# Patient Record
Sex: Female | Born: 1990 | Race: White | Hispanic: Yes | Marital: Married
Health system: Southern US, Community
[De-identification: ages and names within clinical notes are randomized; demographics above are authoritative.]

---

## 2019-10-03 ENCOUNTER — Inpatient Hospital Stay (HOSPITAL_COMMUNITY): Admission: AD | Admit: 2019-10-03 | Payer: Self-pay | Source: Home / Self Care | Admitting: Obstetrics & Gynecology

## 2019-10-29 ENCOUNTER — Other Ambulatory Visit: Payer: Self-pay | Admitting: Family Medicine

## 2019-10-29 DIAGNOSIS — Z3480 Encounter for supervision of other normal pregnancy, unspecified trimester: Secondary | ICD-10-CM

## 2019-11-07 ENCOUNTER — Other Ambulatory Visit: Payer: Self-pay

## 2019-11-07 ENCOUNTER — Ambulatory Visit
Admission: RE | Admit: 2019-11-07 | Discharge: 2019-11-07 | Disposition: A | Discharge status: Home / Self Care | Payer: Self-pay | Source: Ambulatory Visit | Attending: Family Medicine | Admitting: Family Medicine

## 2019-11-07 DIAGNOSIS — Z3480 Encounter for supervision of other normal pregnancy, unspecified trimester: Secondary | ICD-10-CM | POA: Insufficient documentation

## 2021-07-30 ENCOUNTER — Emergency Department: Payer: Self-pay

## 2021-07-30 ENCOUNTER — Emergency Department
Admission: EM | Admit: 2021-07-30 | Discharge: 2021-07-30 | Disposition: A | Discharge status: Home / Self Care | Payer: Self-pay | Attending: Emergency Medicine | Admitting: Emergency Medicine

## 2021-07-30 DIAGNOSIS — K2100 Gastro-esophageal reflux disease with esophagitis, without bleeding: Secondary | ICD-10-CM | POA: Insufficient documentation

## 2021-07-30 LAB — CBC WITH DIFFERENTIAL/PLATELET
Abs Immature Granulocytes: 0.02 10*3/uL (ref 0.00–0.07)
Basophils Absolute: 0 10*3/uL (ref 0.0–0.1)
Basophils Relative: 0 %
Eosinophils Absolute: 0.3 10*3/uL (ref 0.0–0.5)
Eosinophils Relative: 3 %
HCT: 41.4 % (ref 36.0–46.0)
Hemoglobin: 13.5 g/dL (ref 12.0–15.0)
Immature Granulocytes: 0 %
Lymphocytes Relative: 44 %
Lymphs Abs: 4.1 10*3/uL — ABNORMAL HIGH (ref 0.7–4.0)
MCH: 27.9 pg (ref 26.0–34.0)
MCHC: 32.6 g/dL (ref 30.0–36.0)
MCV: 85.5 fL (ref 80.0–100.0)
Monocytes Absolute: 0.7 10*3/uL (ref 0.1–1.0)
Monocytes Relative: 7 %
Neutro Abs: 4.3 10*3/uL (ref 1.7–7.7)
Neutrophils Relative %: 46 %
Platelets: 534 10*3/uL — ABNORMAL HIGH (ref 150–400)
RBC: 4.84 MIL/uL (ref 3.87–5.11)
RDW: 15.5 % (ref 11.5–15.5)
WBC: 9.4 10*3/uL (ref 4.0–10.5)
nRBC: 0 % (ref 0.0–0.2)

## 2021-07-30 LAB — COMPREHENSIVE METABOLIC PANEL
ALT: 14 U/L (ref 0–44)
AST: 18 U/L (ref 15–41)
Albumin: 3.9 g/dL (ref 3.5–5.0)
Alkaline Phosphatase: 76 U/L (ref 38–126)
Anion gap: 5 (ref 5–15)
BUN: 12 mg/dL (ref 6–20)
CO2: 26 mmol/L (ref 22–32)
Calcium: 9.1 mg/dL (ref 8.9–10.3)
Chloride: 107 mmol/L (ref 98–111)
Creatinine, Ser: 0.6 mg/dL (ref 0.44–1.00)
GFR, Estimated: >60 mL/min (ref >60)
Glucose, Bld: 110 mg/dL — ABNORMAL HIGH (ref 70–99)
Potassium: 3.9 mmol/L (ref 3.5–5.1)
Sodium: 138 mmol/L (ref 135–145)
Total Bilirubin: 0.4 mg/dL (ref 0.3–1.2)
Total Protein: 7.9 g/dL (ref 6.5–8.1)

## 2021-07-30 LAB — TROPONIN I (HIGH SENSITIVITY)
Troponin I (High Sensitivity): 3 ng/L (ref <18)
Troponin I (High Sensitivity): <2 ng/L (ref <18)

## 2021-07-30 MED ORDER — LIDOCAINE VISCOUS HCL 2 % MT SOLN
15.0000 mL | Freq: Once | OROMUCOSAL | Status: AC
Start: 2021-07-30 — End: 2021-07-30
  Administered 2021-07-30: 15 mL via ORAL
  Filled 2021-07-30: qty 15

## 2021-07-30 MED ORDER — PANTOPRAZOLE SODIUM 40 MG PO TBEC: 40.0000 mg | DELAYED_RELEASE_TABLET | Freq: Every day | ORAL | 1 refills | Status: AC

## 2021-07-30 MED ORDER — ALUM & MAG HYDROXIDE-SIMETH 200-200-20 MG/5ML PO SUSP
30.0000 mL | Freq: Once | ORAL | Status: AC
  Administered 2021-07-30: 30 mL via ORAL
  Filled 2021-07-30: qty 30

## 2021-07-30 NOTE — ED Triage Notes (Signed)
Pt presents via POV with complaints of CP & SOB. Pt endorses a hx of indigestion and ate some spicy food prior to bed and work up with chest pain. Denies cardiac hx.

## 2021-07-30 NOTE — ED Provider Notes (Signed)
[facility] Provider Note    Event Date/Time   First MD Initiated Contact with Patient 07/30/21 [phone]     (approximate)   History   Chest pain   HPI  Kristine Combs Kristine Combs is a 31 y.o. female with no significant past medical history who presents with complaints of chest pain.  Patient reports yesterday while eating spicy food she developed discomfort in her chest.  She reports this is happened before and usually goes away shortly after eating however this continued throughout the night.  No significant shortness of breath.  Review of medical records demonstrates the patient was seen at [facility] emergency department on June 10, 2021 for similar complaints.  Patient does admit to having heartburn after eating   Physical Exam   Triage Vital Signs: ED Triage Vitals [07/30/21 0448]  Enc Vitals Group     BP 137/84     Pulse Rate 82     Resp 18     Temp 98.7 F (37.1 C)     Temp src      SpO2 98 %     Weight 77.1 kg (170 lb)     Height 1.524 m (5')     Head Circumference      Peak Flow      Pain Score 7     Pain Loc      Pain Edu?      Excl. in GC?     Most recent vital signs: Vitals:   07/30/21 0448 07/30/21 0635  BP: 137/84 123/88  Pulse: 82 85  Resp: 18 18  Temp: 98.7 F (37.1 C)   SpO2: 98% 99%     General: Awake, no distress.  CV:  Good peripheral perfusion.  Regular rate and rhythm, no murmurs Resp:  Normal effort.  CTA B Abd:  No distention.  Soft, nontender Other:     ED Results / Procedures / Treatments   Labs (all labs ordered are listed, but only abnormal results are displayed) Labs Reviewed  CBC WITH DIFFERENTIAL/PLATELET - Abnormal; Notable for the following components:      Result Value   Platelets 534 (*)    Lymphs Abs 4.1 (*)    All other components within normal limits  COMPREHENSIVE METABOLIC PANEL - Abnormal; Notable for the following components:   Glucose, Bld 110 (*)    All other components within normal  limits  TROPONIN I (HIGH SENSITIVITY)  TROPONIN I (HIGH SENSITIVITY)     EKG  ED ECG REPORT I, Jene Every, the attending physician, personally viewed and interpreted this ECG.  Date: 07/30/2021  Rhythm: normal sinus rhythm QRS Axis: normal Intervals: normal ST/T Wave abnormalities: normal Narrative Interpretation: no evidence of acute ischemia    RADIOLOGY Chest x-ray reviewed by me, no acute abnormality    PROCEDURES:  Critical Care performed:   Procedures   MEDICATIONS ORDERED IN ED: Medications  alum & mag hydroxide-simeth (MAALOX/MYLANTA) 200-200-20 MG/5ML suspension 30 mL (30 mLs Oral Given 07/30/21 0819)    And  lidocaine (XYLOCAINE) 2 % viscous mouth solution 15 mL (15 mLs Oral Given 07/30/21 0819)     IMPRESSION / MDM / ASSESSMENT AND PLAN / ED COURSE  I reviewed the triage vital signs and the nursing notes.  Patient well-appearing in no acute distress, vital signs are reassuring.  EKG is unremarkable.  Doubt ACS given normal EKG and no significant risk factors.  High sensitive troponin negative x2.  CMP and CBC are normal  Suspicious for gastritis/GERD given HPI.  Will trial GI cocktail  ----------------------------------------- 8:44 AM on 07/30/2021 ----------------------------------------- Patient symptoms resolved with GI cocktail consistent with GERD/esophagitis.  We will start the patient on Protonix, outpatient follow-up recommended, return precautions discussed          FINAL CLINICAL IMPRESSION(S) / ED DIAGNOSES   Final diagnoses:  Gastroesophageal reflux disease with esophagitis without hemorrhage     Rx / DC Orders   ED Discharge Orders          Ordered    pantoprazole (PROTONIX) 40 MG tablet  Daily        07/30/21 0834             Note:  This document was prepared using Dragon voice recognition software and may include unintentional dictation errors.   Jene Every, MD 07/30/21 [phone]

## 2022-01-31 IMAGING — US US OB COMP +14 WK
1 series · 13 of 28 positions shown · non-contrast
Comparison: none

CLINICAL DATA: Fetal anatomy evaluation

EXAM:
OBSTETRICAL ULTRASOUND >14 WKS

[Series 1: us ob comp +14 wk · 0.23mm/px · 91 acquisitions, 13 frames shown]
[im 4/91]
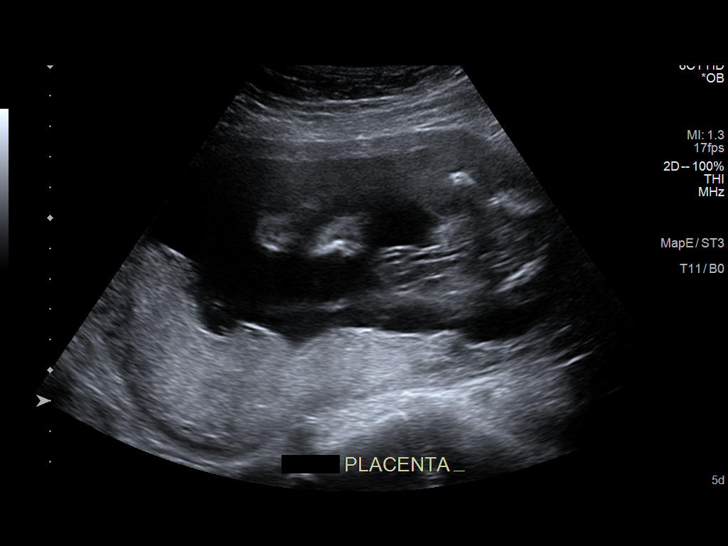
[im 11/91]
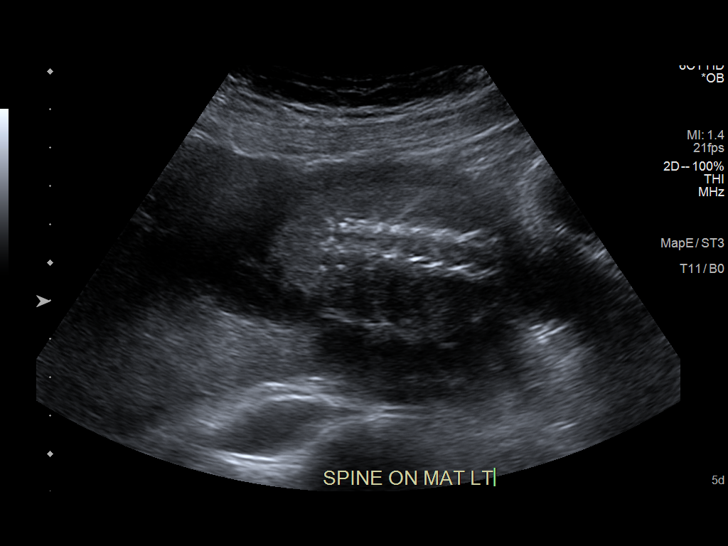
[im 17/91]
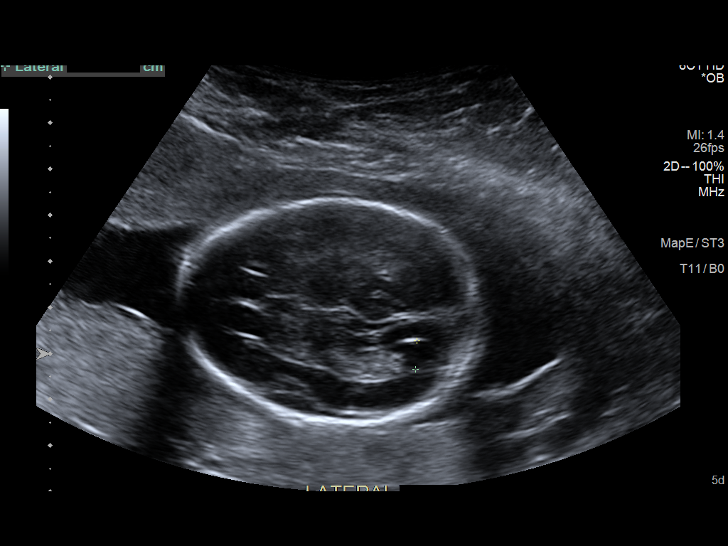
[im 24/91]
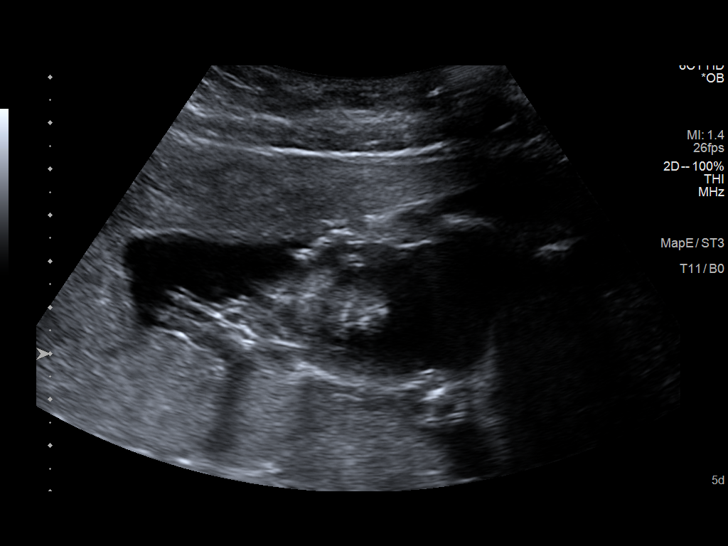
[im 31/91]
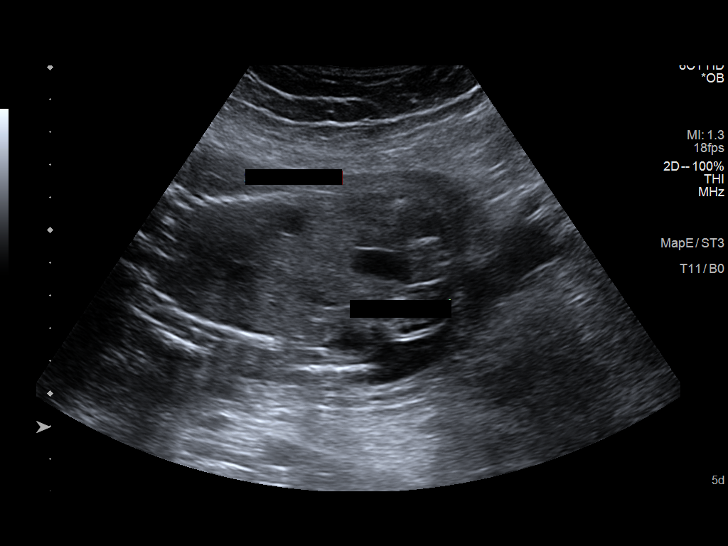
[im 37/91]
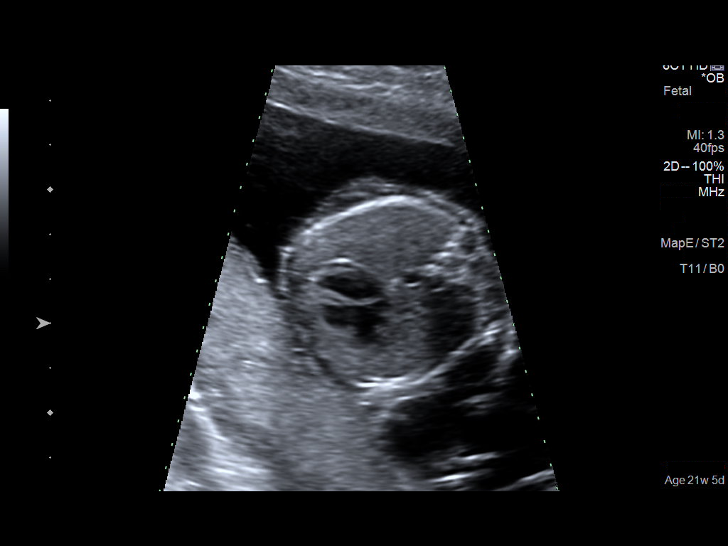
[im 47/91]
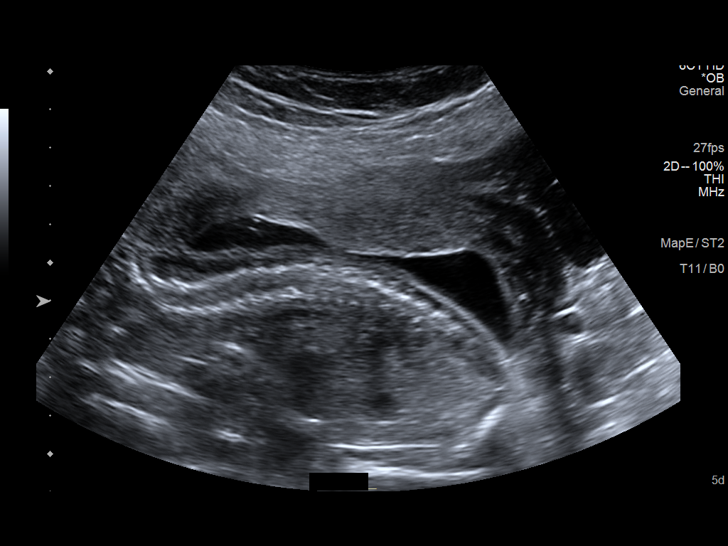
[im 54/91]
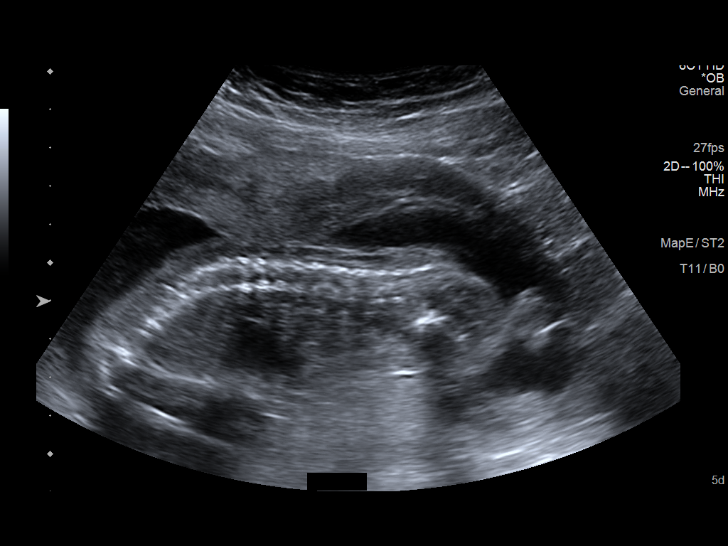
[im 61/91]
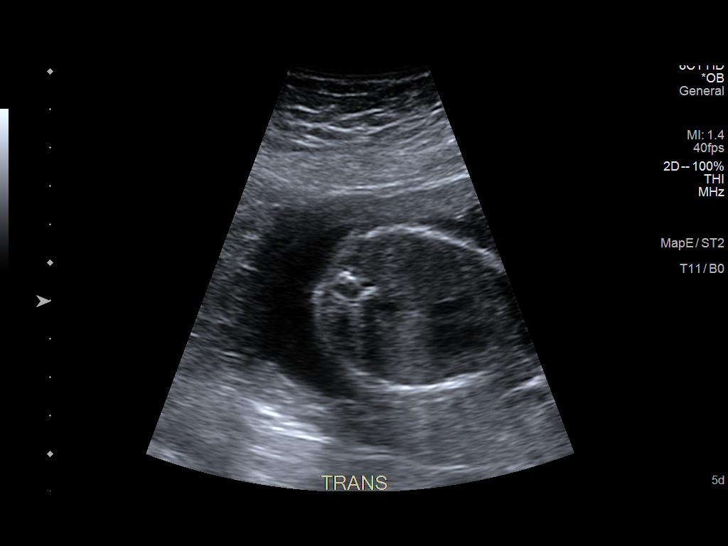
[im 67/91]
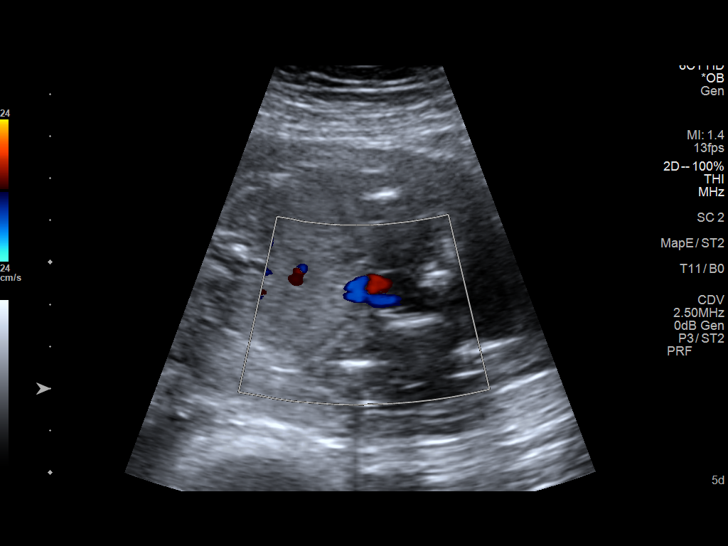
[im 74/91]
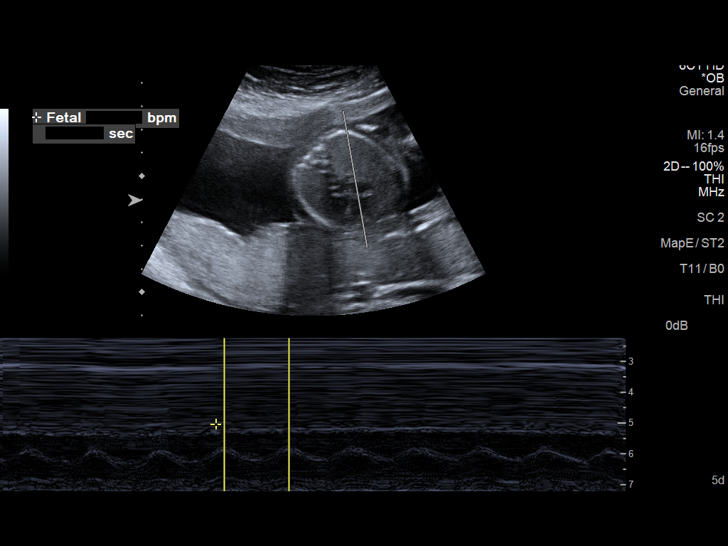
[im 81/91]
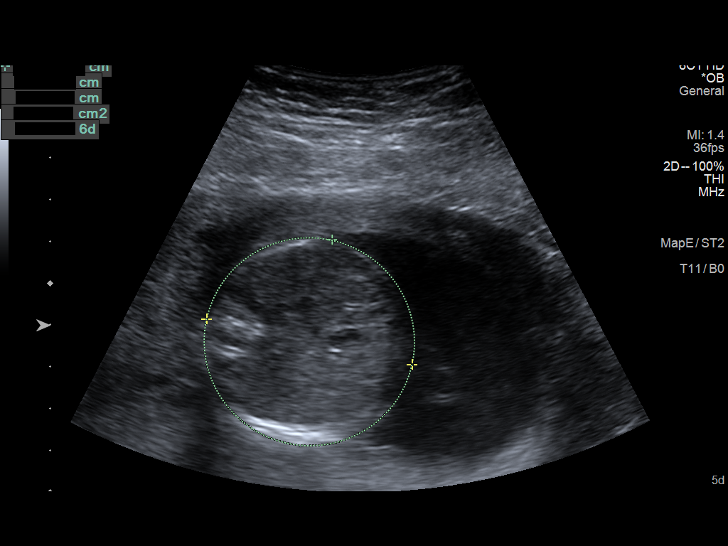
[im 87/91]
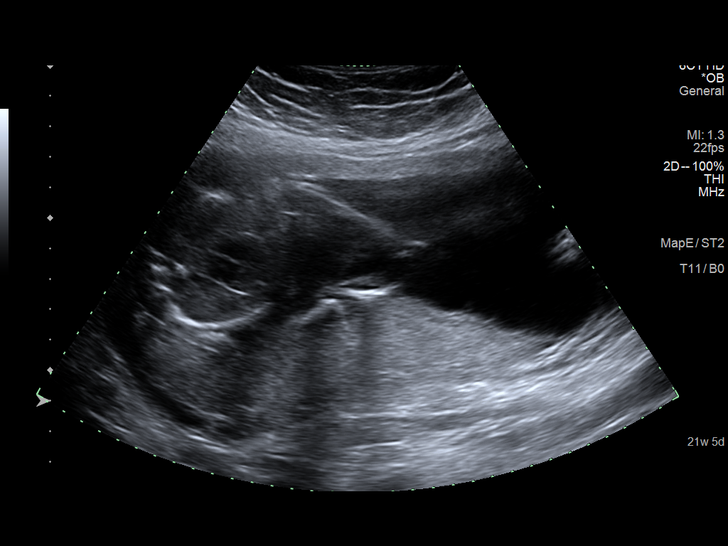

[13 of 28 positions shown; findings below may reference images not displayed]

FINDINGS: Number of Fetuses: 1

Heart Rate:  158 bpm

Movement: Yes

Presentation: Variable, breech

Previa: No

Placental Location: Posterior

Amniotic Fluid (Subjective): Normal

Amniotic Fluid (Objective):

Vertical pocket = 4.8cm

FETAL BIOMETRY

BPD: 4.8cm 20w 4d

HC:   18.3cm 20w 5d

AC:   15.8cm 20w 6d

FL:   3.6cm 21w 4d

Current Mean GA: 21w 1d US EDC: 03/18/2020

Assigned GA:  21w 5d Assigned EDC: 03/14/2020

Estimated Fetal Weight:  402g 18%ile

FETAL ANATOMY

Lateral Ventricles: Appears normal

Thalami/CSP: Appears normal

Posterior Fossa:  Appears normal

Nuchal Region: Appears normal   NFT= N/A > 20 WKS

Upper Lip: Appears normal

Spine: Appears normal

4 Chamber Heart on Left: Appears normal

LVOT: Appears normal

RVOT: Appears normal

Stomach on Left: Appears normal

3 Vessel Cord: Appears normal

Cord Insertion site: Appears normal

Kidneys: Appears normal

Bladder: Appears normal

Extremities: Appears normal

Technically difficult due to: None

Maternal Findings:

Cervix:  Cervix is closed and measures 5.3 cm in length
IMPRESSION: Single live intrauterine pregnancy as detailed above.

## 2023-10-24 IMAGING — CR DG CHEST 2V
2 series · 2 of 2 positions shown · non-contrast
Comparison: None.

CLINICAL DATA: 30-year-old female with chest pain and shortness of
breath.

EXAM:
CHEST - 2 VIEW

[chest pa]
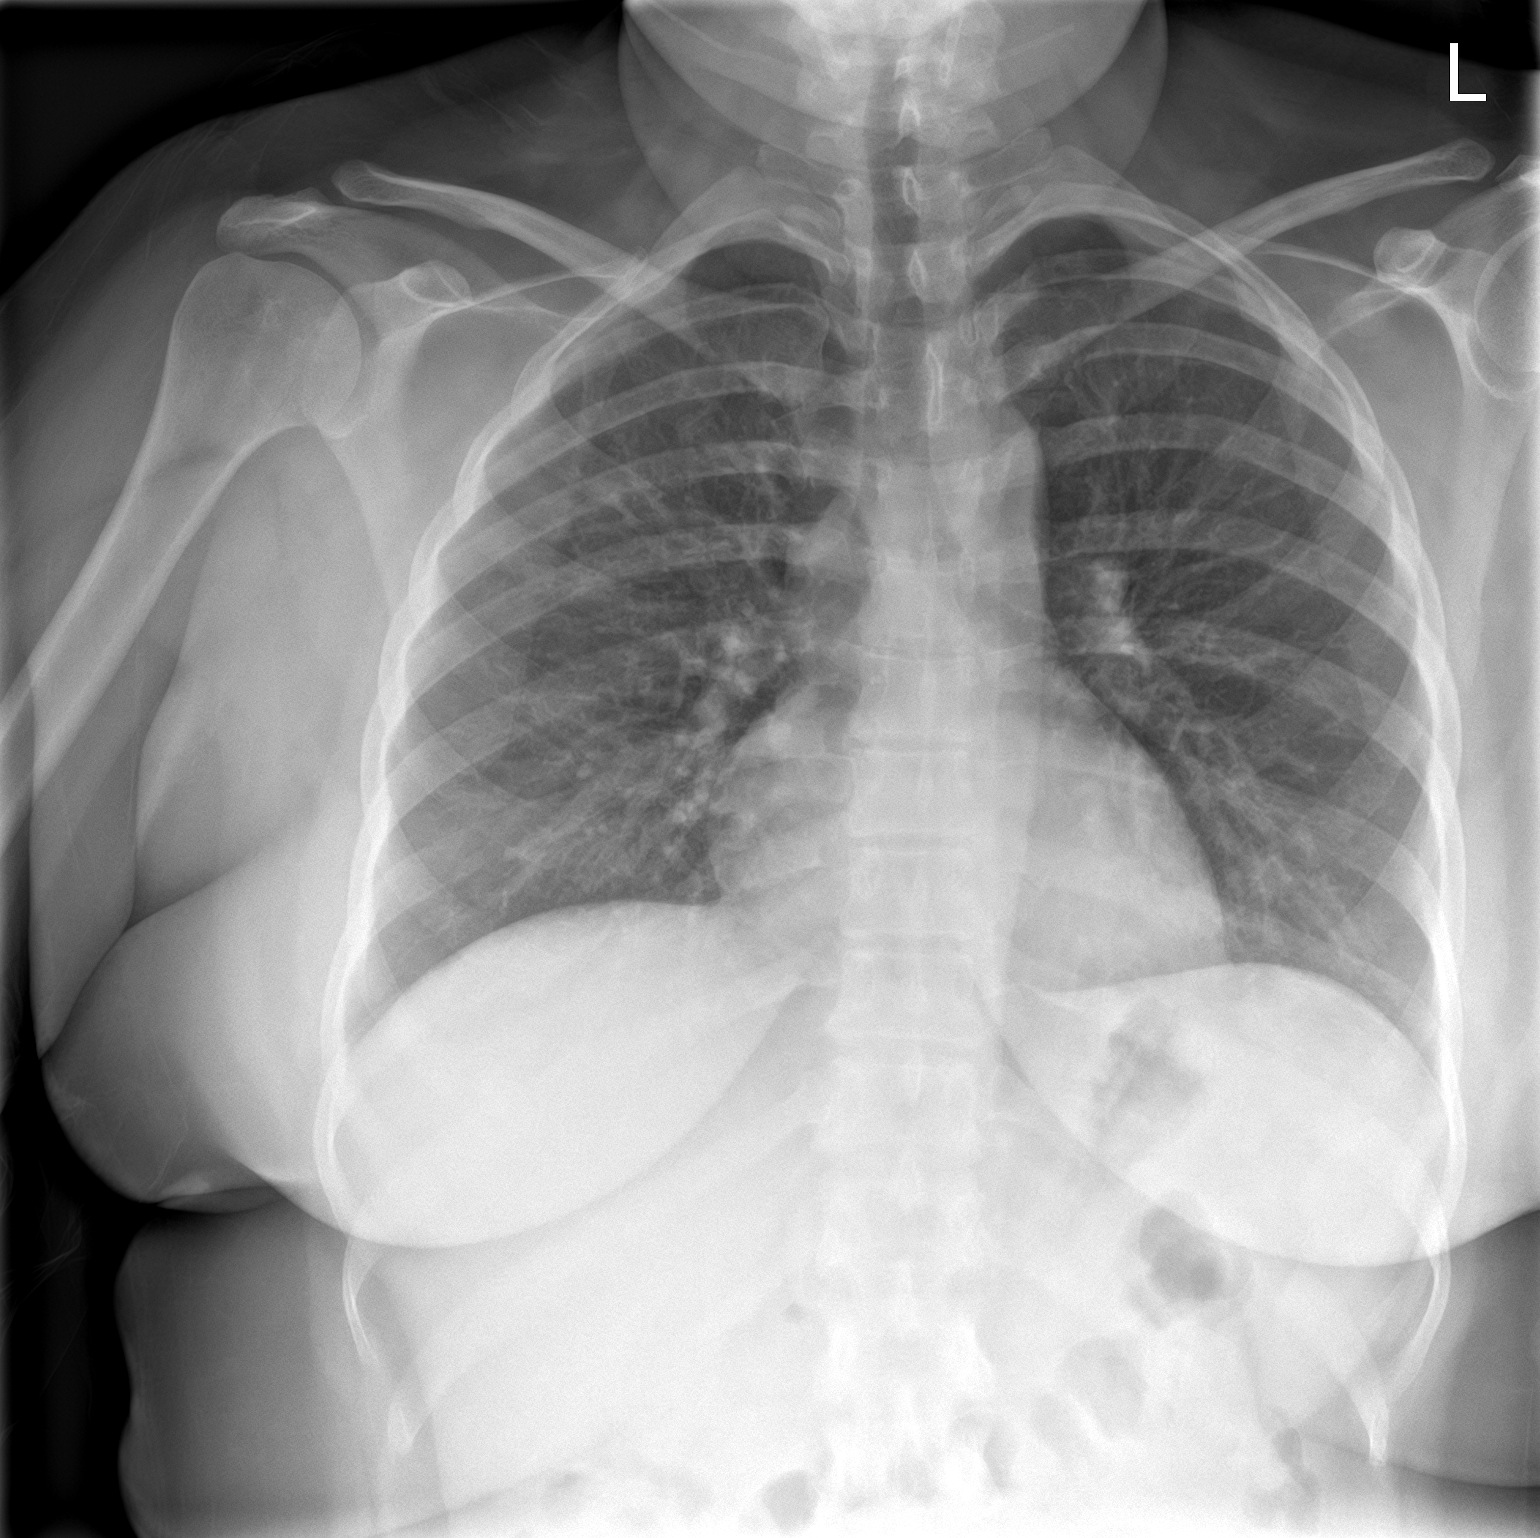

[chest lat]
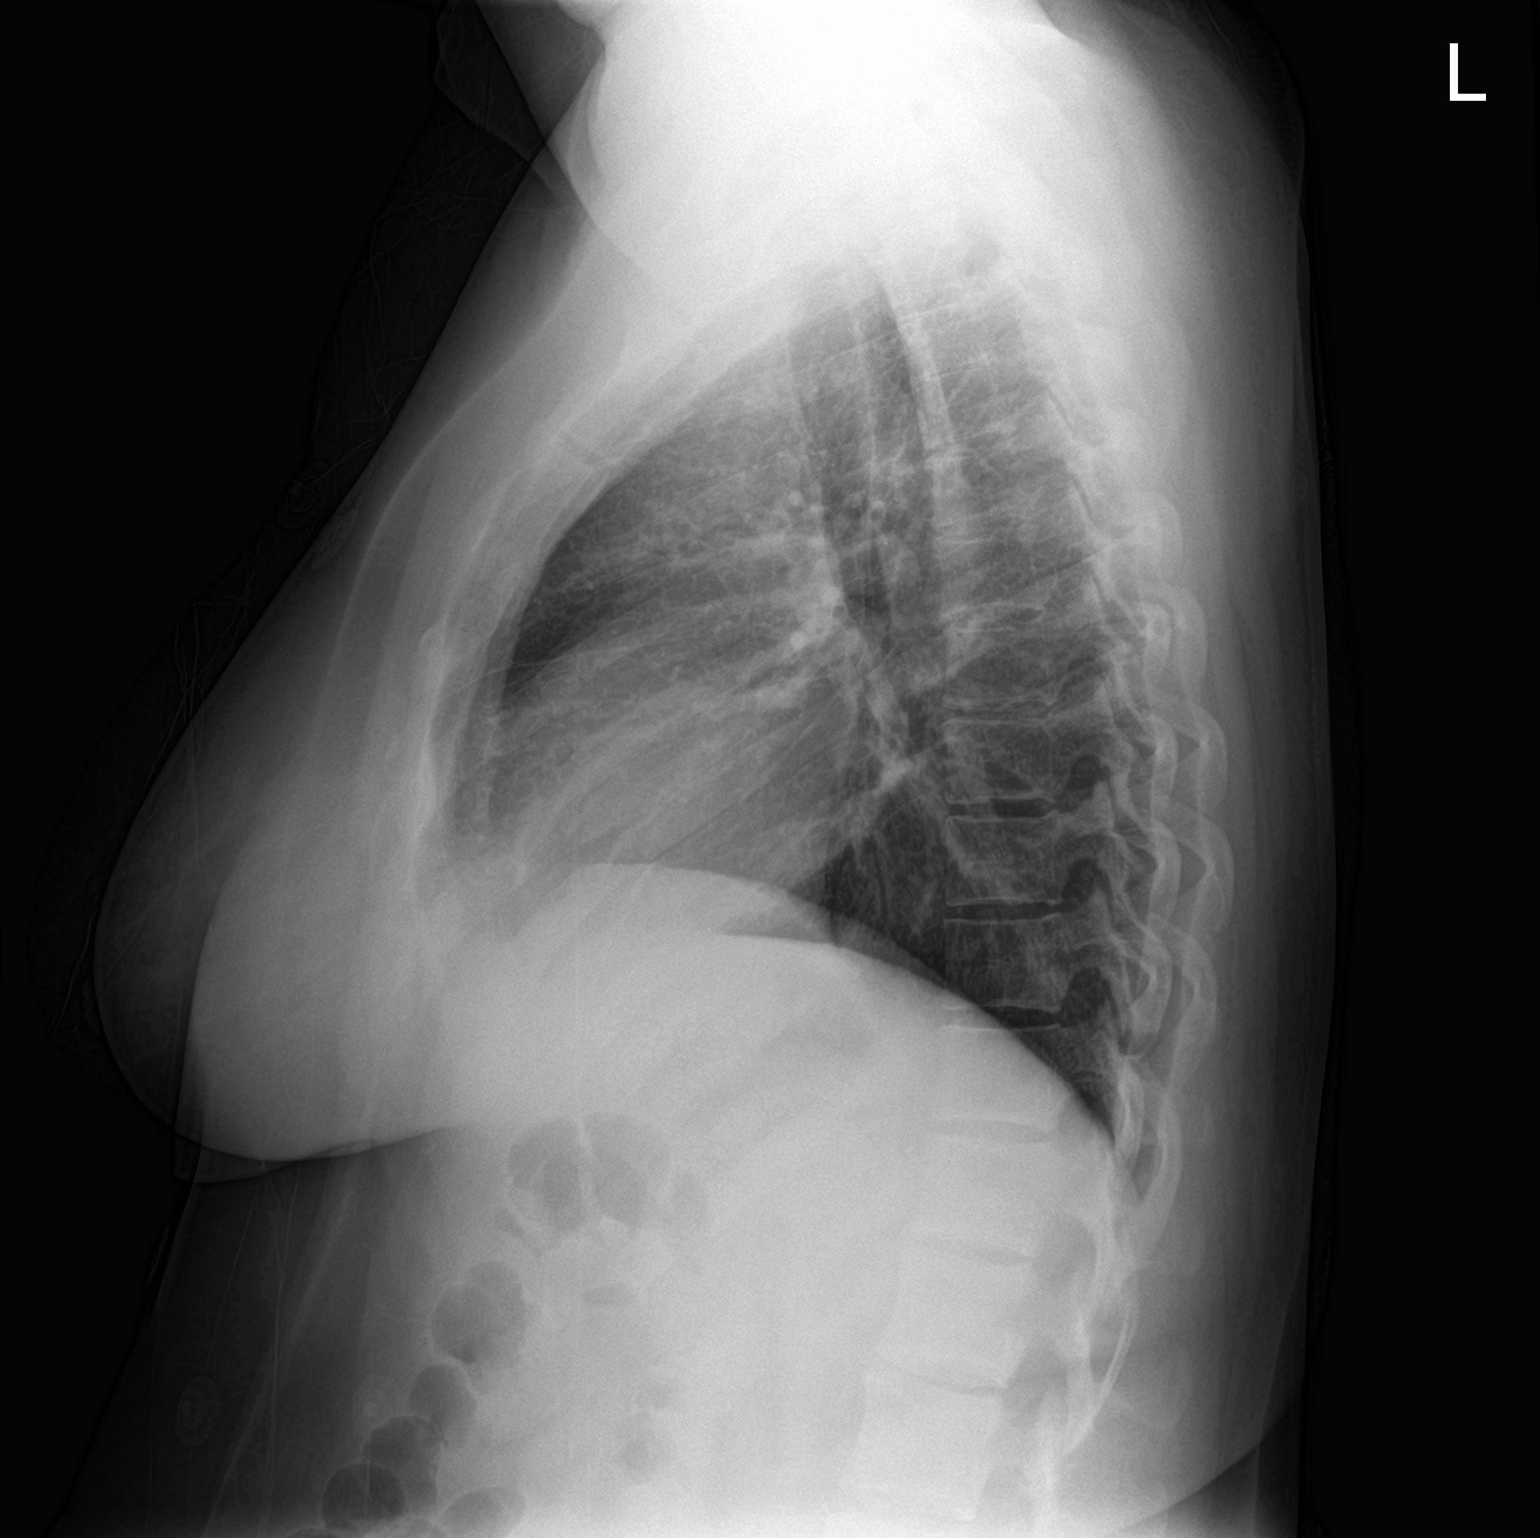

[2 of 2 positions shown; findings below may reference images not displayed]

FINDINGS: Mildly low lung volumes. Normal cardiac size and mediastinal
contours. Visualized tracheal air column is within normal limits.
Both lungs are clear. No pneumothorax or pleural effusion.

Normal visible bowel gas pattern. No pneumoperitoneum. No osseous
abnormality identified.
IMPRESSION: Negative.  No cardiopulmonary abnormality.
